# Patient Record
Sex: Male | Born: 1935 | Race: White | Hispanic: No | Marital: Married | Smoking: Former smoker
Health system: Southern US, Community
[De-identification: ages and names within clinical notes are randomized; demographics above are authoritative.]

## PROBLEM LIST (undated history)

## (undated) DIAGNOSIS — I10 Essential (primary) hypertension: Secondary | ICD-10-CM

## (undated) DIAGNOSIS — C801 Malignant (primary) neoplasm, unspecified: Secondary | ICD-10-CM

## (undated) HISTORY — PX: CATARACT EXTRACTION, BILATERAL: SHX1313

---

## 2019-08-11 ENCOUNTER — Other Ambulatory Visit: Payer: Self-pay

## 2019-08-11 ENCOUNTER — Encounter: Payer: Self-pay | Admitting: Emergency Medicine

## 2019-08-11 ENCOUNTER — Emergency Department: Payer: Medicare Other

## 2019-08-11 ENCOUNTER — Emergency Department
Admission: EM | Admit: 2019-08-11 | Discharge: 2019-08-11 | Disposition: A | Discharge status: Home / Self Care | Payer: Medicare Other | Attending: Emergency Medicine | Admitting: Emergency Medicine

## 2019-08-11 DIAGNOSIS — Z87891 Personal history of nicotine dependence: Secondary | ICD-10-CM | POA: Insufficient documentation

## 2019-08-11 DIAGNOSIS — I10 Essential (primary) hypertension: Secondary | ICD-10-CM | POA: Insufficient documentation

## 2019-08-11 DIAGNOSIS — Z8546 Personal history of malignant neoplasm of prostate: Secondary | ICD-10-CM | POA: Insufficient documentation

## 2019-08-11 DIAGNOSIS — R0602 Shortness of breath: Secondary | ICD-10-CM | POA: Diagnosis present

## 2019-08-11 HISTORY — DX: Essential (primary) hypertension: I10

## 2019-08-11 HISTORY — DX: Malignant (primary) neoplasm, unspecified: C80.1

## 2019-08-11 LAB — FIBRIN DERIVATIVES D-DIMER (ARMC ONLY): Fibrin derivatives D-dimer (ARMC): 674.43 ng/mL (FEU) — ABNORMAL HIGH (ref 0.00–499.00)

## 2019-08-11 LAB — CBC
HCT: 41.3 % (ref 39.0–52.0)
Hemoglobin: 14.4 g/dL (ref 13.0–17.0)
MCH: 33.7 pg (ref 26.0–34.0)
MCHC: 34.9 g/dL (ref 30.0–36.0)
MCV: 96.7 fL (ref 80.0–100.0)
Platelets: 203 10*3/uL (ref 150–400)
RBC: 4.27 MIL/uL (ref 4.22–5.81)
RDW: 11.9 % (ref 11.5–15.5)
WBC: 5.8 10*3/uL (ref 4.0–10.5)
nRBC: 0 % (ref 0.0–0.2)

## 2019-08-11 LAB — COMPREHENSIVE METABOLIC PANEL
ALT: 18 U/L (ref 0–44)
AST: 21 U/L (ref 15–41)
Albumin: 4 g/dL (ref 3.5–5.0)
Alkaline Phosphatase: 45 U/L (ref 38–126)
Anion gap: 8 (ref 5–15)
BUN: 13 mg/dL (ref 8–23)
CO2: 22 mmol/L (ref 22–32)
Calcium: 8.5 mg/dL — ABNORMAL LOW (ref 8.9–10.3)
Chloride: 106 mmol/L (ref 98–111)
Creatinine, Ser: 0.85 mg/dL (ref 0.61–1.24)
GFR calc Af Amer: >60 mL/min (ref >60)
GFR calc non Af Amer: >60 mL/min (ref >60)
Glucose, Bld: 109 mg/dL — ABNORMAL HIGH (ref 70–99)
Potassium: 4.2 mmol/L (ref 3.5–5.1)
Sodium: 136 mmol/L (ref 135–145)
Total Bilirubin: 1.1 mg/dL (ref 0.3–1.2)
Total Protein: 7 g/dL (ref 6.5–8.1)

## 2019-08-11 LAB — TROPONIN I (HIGH SENSITIVITY)
Troponin I (High Sensitivity): 4 ng/L (ref <18)
Troponin I (High Sensitivity): 5 ng/L (ref <18)

## 2019-08-11 LAB — BRAIN NATRIURETIC PEPTIDE: B Natriuretic Peptide: 243 pg/mL — ABNORMAL HIGH (ref 0.0–100.0)

## 2019-08-11 MED ORDER — LORATADINE 10 MG PO CAPS: 10.0000 mg | ORAL_CAPSULE | Freq: Every day | ORAL | 0 refills | Status: DC

## 2019-08-11 MED ORDER — ALBUTEROL SULFATE (2.5 MG/3ML) 0.083% IN NEBU: 5.0000 mg | INHALATION_SOLUTION | Freq: Once | RESPIRATORY_TRACT | Status: DC

## 2019-08-11 MED ORDER — IOHEXOL 350 MG/ML SOLN
75.0000 mL | Freq: Once | INTRAVENOUS | Status: AC | PRN
  Administered 2019-08-11: 75 mL via INTRAVENOUS

## 2019-08-11 NOTE — ED Provider Notes (Signed)
[facility] Emergency Department Provider Note  ____________________________________________  Time seen: Approximately 5:11 PM  I have reviewed the triage vital signs and the nursing notes.   HISTORY  Chief Complaint Shortness of Breath    HPI Edward Livingston is a 86 y.o. male with a history of hypertension and prostate cancer who comes ED complaining of shortness of breath that is intermittent, occurring over the last 3 days or so.  Worse laying down, better sitting upright.  Not worse with exertion.  Denies chest pain.  Reports some lower extremity edema without leg pain.  His doctor prescribed him albuterol which does not seem to be helping.  He lives in [place] and flew out to [place] 4 days ago for vacation.  Reports that his prostate cancer was treated with chemo and radiation and he regards it as cured.      Past Medical History:  Diagnosis Date  . Cancer [facility])    Prostate  . Hypertension      There are no problems to display for this patient.    History reviewed. No pertinent surgical history.   Prior to Admission medications   Medication Sig Start Date End Date Taking? Authorizing Provider  Loratadine 10 MG CAPS Take 1 capsule (10 mg total) by mouth at bedtime for 20 days. 08/11/19 08/31/19  Carrie Mew, MD     Allergies Patient has no known allergies.   History reviewed. No pertinent family history.  Social History Social History   Tobacco Use  . Smoking status: Former Research scientist (life sciences)  . Smokeless tobacco: Never Used  Substance Use Topics  . Alcohol use: Yes  . Drug use: Never    Review of Systems  Constitutional:   No fever or chills.  ENT:   No sore throat. No rhinorrhea. Cardiovascular:   No chest pain or syncope. Respiratory: Positive shortness of breath without cough. Gastrointestinal:   Negative for abdominal pain, vomiting and diarrhea.  Musculoskeletal:   Negative for focal pain or swelling All other  systems reviewed and are negative except as documented above in ROS and HPI.  ____________________________________________   PHYSICAL EXAM:  VITAL SIGNS: ED Triage Vitals  Enc Vitals Group     BP 08/11/19 1241 117/78     Pulse Rate 08/11/19 1241 83     Resp 08/11/19 1241 18     Temp 08/11/19 1241 98 F (36.7 C)     Temp Source 08/11/19 1241 Oral     SpO2 08/11/19 1241 98 %     Weight 08/11/19 1244 160 lb (72.6 kg)     Height 08/11/19 1244 5\' 4"  (1.626 m)     Head Circumference --      Peak Flow --      Pain Score 08/11/19 1244 0     Pain Loc --      Pain Edu? --      Excl. in [place]? --     Vital signs reviewed, nursing assessments reviewed.   Constitutional:   Alert and oriented. Non-toxic appearance. Eyes:   Conjunctivae are normal. EOMI. PERRL. ENT      Head:   Normocephalic and atraumatic.      Nose:   Wearing a mask.      Mouth/Throat:   Wearing a mask.      Neck:   No meningismus. Full ROM. Hematological/Lymphatic/Immunilogical:   No cervical lymphadenopathy. Cardiovascular:   RRR. Symmetric bilateral radial and DP pulses.  No murmurs. Cap refill less than 2  seconds. Respiratory:   Normal respiratory effort without tachypnea/retractions. Breath sounds are clear and equal bilaterally. No wheezes/rales/rhonchi.  No cough or wheezing with FEV1 maneuver. Gastrointestinal:   Soft and nontender. Non distended. There is no CVA tenderness.  No rebound, rigidity, or guarding.  Musculoskeletal:   Normal range of motion in all extremities. No joint effusions.  No lower extremity tenderness.  No edema.  Negative Bevelyn Buckles' sign Neurologic:   Normal speech and language.  Motor grossly intact. No acute focal neurologic deficits are appreciated.  Skin:    Skin is warm, dry and intact. No rash noted.  No petechiae, purpura, or bullae.  ____________________________________________    LABS (pertinent positives/negatives) (all labs ordered are listed, but only abnormal results are  displayed) Labs Reviewed  COMPREHENSIVE METABOLIC PANEL - Abnormal; Notable for the following components:      Result Value   Glucose, Bld 109 (*)    Calcium 8.5 (*)    All other components within normal limits  BRAIN NATRIURETIC PEPTIDE - Abnormal; Notable for the following components:   B Natriuretic Peptide 243.0 (*)    All other components within normal limits  FIBRIN DERIVATIVES D-DIMER ([facility] ONLY) - Abnormal; Notable for the following components:   Fibrin derivatives D-dimer [facility]) 674.43 (*)    All other components within normal limits  CBC  TROPONIN I (HIGH SENSITIVITY)  TROPONIN I (HIGH SENSITIVITY)   ____________________________________________   EKG  Interpreted by me Atrial fibrillation, rate of 74, left axis, normal intervals.  Poor R wave progression.  Normal ST segments and T waves.  ____________________________________________    [id number]  DG Chest 2 View  Result Date: 08/11/2019 CLINICAL DATA:  Shortness of breath. Additional provided: Patient reports having a sleep up in a recliner, orthopnea, leg swelling. EXAM: CHEST - 2 VIEW COMPARISON:  No pertinent prior studies available for comparison. FINDINGS: Mild cardiomegaly. Aortic atherosclerosis. No appreciable airspace consolidation or overt pulmonary edema. No evidence of pleural effusion or pneumothorax. No acute bony abnormality identified. Thoracic spondylosis. IMPRESSION: Mild cardiomegaly. Aortic Atherosclerosis (ICD10-I70.0). No appreciable airspace consolidation or overt pulmonary edema. Thoracic spondylosis. Electronically Signed   By: Kellie Simmering DO   On: 08/11/2019 13:35   CT Angio Chest PE W and/or Wo Contrast  Result Date: 08/11/2019 CLINICAL DATA:  Shortness of breath.  Orthopnea and leg swelling. EXAM: CT ANGIOGRAPHY CHEST WITH CONTRAST TECHNIQUE: Multidetector CT imaging of the chest was performed using the standard protocol during bolus administration of intravenous contrast. Multiplanar CT image  reconstructions and MIPs were obtained to evaluate the vascular anatomy. CONTRAST:  46mL OMNIPAQUE IOHEXOL 350 MG/ML SOLN COMPARISON:  None. FINDINGS: Cardiovascular: No cardiomegaly, although the left atrium has a prominent size. Small low-density pericardial effusion. No pulmonary artery filling defect. Atherosclerotic calcification of the aorta and coronaries. Mediastinum/Nodes: No adenopathy or mass. Lungs/Pleura: Central airways are clear. There is no edema, consolidation, effusion, or pneumothorax. Upper Abdomen: IVC reflux but no right heart dilatation. Musculoskeletal: Spondylosis/spondyloarthropathy with bridging lower thoracic osteophytes. No acute finding Review of the MIP images confirms the above findings. IMPRESSION: 1. No acute finding, including pulmonary embolism. 2. Aortic Atherosclerosis (ICD10-I70.0).  Coronary atherosclerosis. Electronically Signed   By: Monte Fantasia M.D.   On: 08/11/2019 19:01    ____________________________________________   PROCEDURES Procedures  ____________________________________________  DIFFERENTIAL DIAGNOSIS   Non-STEMI, pulmonary embolism, seasonal allergies, fatigue  CLINICAL IMPRESSION / ASSESSMENT AND PLAN / ED COURSE  Medications ordered in the ED: Medications  iohexol (OMNIPAQUE) 350 MG/ML injection 75 mL (  75 mLs Intravenous Contrast Given 08/11/19 1837)    Pertinent labs & imaging results that were available during my care of the patient were reviewed by me and considered in my medical decision making (see chart for details).  Edward Livingston was evaluated in Emergency Department on 08/11/2019 for the symptoms described in the history of present illness. He was evaluated in the context of the global COVID-19 pandemic, which necessitated consideration that the patient might be at risk for infection with the SARS-CoV-2 virus that causes COVID-19. Institutional protocols and algorithms that pertain to the evaluation of patients at risk for  COVID-19 are in a state of rapid change based on information released by regulatory bodies including the CDC and federal and state organizations. These policies and algorithms were followed during the patient's care in the ED.   Patient presents with shortness of breath with lying down without evidence of frank congestive heart failure based on symptoms and exam. Considering the patient's symptoms, medical history, and physical examination today, I have low suspicion for ACS, TAD, pneumothorax, carditis, mediastinitis, pneumonia, CHF, or sepsis.  Vital signs normal, exam normal, initial labs and first troponin normal.  Given comorbidities, will obtain a second troponin to evaluate for non-STEMI.  With history of prostate cancer and travel, obtain D-dimer to eval for PE given normal vital signs and lack of chest pain and intermittent shortness of breath being unlikely to be due to PE.  Patient has a history of "cardiac arrhythmia" from 2 years ago from PCP, likely his A. fib seen on EKG today is chronic.  We will obtain a second troponin, D-dimer.  If unremarkable would start on H2 blocker, discharge for outpatient follow-up.    Clinical Course as of Aug 10 1913  Fri Aug 11, 2019  1912 CT angiogram of the chest negative for PE or other acute findings.   [PS]    Clinical Course User Index [PS] Carrie Mew, MD     ----------------------------------------- 7:15 PM on 08/11/2019 -----------------------------------------  Serial troponins negative.  ____________________________________________   FINAL CLINICAL IMPRESSION(S) / ED DIAGNOSES    Final diagnoses:  SOB (shortness of breath)     ED Discharge Orders         Ordered    Loratadine 10 MG CAPS  Daily at bedtime     08/11/19 1915          Portions of this note were generated with dragon dictation software. Dictation errors may occur despite best attempts at proofreading.   Carrie Mew, MD 08/11/19  [phone]

## 2019-08-11 NOTE — ED Triage Notes (Signed)
First nurse note- here from [place]. Has been having SHOB for a while. Given inhalers by MD. Has to sleep up in recliner. + orthopnea. + leg swelling. No dx CHF.

## 2019-08-11 NOTE — Discharge Instructions (Signed)
Your EKG, lab test, and CT scan of the chest were all okay today.  Please start taking a daily antihistamine, and follow-up with cardiology if your symptoms do not resolve within the next few days.

## 2019-08-11 NOTE — ED Notes (Signed)
2nd sets of blood tubes sent to lab, red blue and green

## 2019-08-11 NOTE — ED Triage Notes (Signed)
Pt to ED with c/o of SOB and feeling anxious. Pt states he can't lay down to sleep. Pt denies chest pain at this time. Pt prescribed inhalers by PCP with no relief.

## 2019-08-15 ENCOUNTER — Encounter: Payer: Self-pay | Admitting: Cardiology

## 2019-08-15 ENCOUNTER — Other Ambulatory Visit: Payer: Self-pay

## 2019-08-15 ENCOUNTER — Ambulatory Visit (INDEPENDENT_AMBULATORY_CARE_PROVIDER_SITE_OTHER): Payer: Medicare Other | Admitting: Cardiology

## 2019-08-15 VITALS — BP 116/78 | HR 89 | Ht 64.0 in | Wt 158.0 lb

## 2019-08-15 DIAGNOSIS — I4891 Unspecified atrial fibrillation: Secondary | ICD-10-CM | POA: Diagnosis not present

## 2019-08-15 DIAGNOSIS — R0602 Shortness of breath: Secondary | ICD-10-CM

## 2019-08-15 MED ORDER — APIXABAN 5 MG PO TABS: 5.0000 mg | ORAL_TABLET | Freq: Two times a day (BID) | ORAL | 2 refills | Status: AC

## 2019-08-15 NOTE — Patient Instructions (Signed)
Medication Instructions:  Your physician has recommended you make the following change in your medication:  START Eliquis 5 mg (1tablet) by mouth two times a day.  *If you need a refill on your cardiac medications before your next appointment, please call your pharmacy*  Lab Work: none If you have labs (blood work) drawn today and your tests are completely normal, you will receive your results only by: Marland Kitchen MyChart Message (if you have MyChart) OR . A paper copy in the mail If you have any lab test that is abnormal or we need to change your treatment, we will call you to review the results.   Testing/Procedures: Your physician has requested that you have an echocardiogram. Echocardiography is a painless test that uses sound waves to create images of your heart. It provides your doctor with information about the size and shape of your heart and how well your heart's chambers and valves are working. This procedure takes approximately one hour. There are no restrictions for this procedure. You may get an IV, if needed, to receive an ultrasound enhancing agent through to better visualize your heart.     Follow-Up: At [facility], you and your health needs are our priority.  As part of our continuing mission to provide you with exceptional heart care, we have created designated Provider Care Teams.  These Care Teams include your primary Cardiologist (physician) and Advanced Practice Providers (APPs -  Physician Assistants and Nurse Practitioners) who all work together to provide you with the care you need, when you need it.  We recommend signing up for the patient portal called "MyChart".  Sign up information is provided on this After Visit Summary.  MyChart is used to connect with patients for Virtual Visits (Telemedicine).  Patients are able to view lab/test results, encounter notes, upcoming appointments, etc.  Non-urgent messages can be sent to your provider as well.   To learn more about what  you can do with MyChart, go to [email or web address].    Your next appointment:   After echo as soon as possible.  The format for your next appointment:   In Person  Provider:   Kate Sable, MD    Echocardiogram An echocardiogram is a procedure that uses painless sound waves (ultrasound) to produce an image of the heart. Images from an echocardiogram can provide important information about:  Signs of coronary artery disease (CAD).  Aneurysm detection. An aneurysm is a weak or damaged part of an artery wall that bulges out from the normal force of blood pumping through the body.  Heart size and shape. Changes in the size or shape of the heart can be associated with certain conditions, including heart failure, aneurysm, and CAD.  Heart muscle function.  Heart valve function.  Signs of a past heart attack.  Fluid buildup around the heart.  Thickening of the heart muscle.  A tumor or infectious growth around the heart valves. Tell a health care provider about:  Any allergies you have.  All medicines you are taking, including vitamins, herbs, eye drops, creams, and over-the-counter medicines.  Any blood disorders you have.  Any surgeries you have had.  Any medical conditions you have.  Whether you are pregnant or may be pregnant. What are the risks? Generally, this is a safe procedure. However, problems may occur, including:  Allergic reaction to dye (contrast) that may be used during the procedure. What happens before the procedure? No specific preparation is needed. You may eat and drink normally. What happens  during the procedure?   An IV tube may be inserted into one of your veins.  You may receive contrast through this tube. A contrast is an injection that improves the quality of the pictures from your heart.  A gel will be applied to your chest.  A wand-like tool (transducer) will be moved over your chest. The gel will help to transmit the sound  waves from the transducer.  The sound waves will harmlessly bounce off of your heart to allow the heart images to be captured in real-time motion. The images will be recorded on a computer. The procedure may vary among health care providers and hospitals. What happens after the procedure?  You may return to your normal, everyday life, including diet, activities, and medicines, unless your health care provider tells you not to do that. Summary  An echocardiogram is a procedure that uses painless sound waves (ultrasound) to produce an image of the heart.  Images from an echocardiogram can provide important information about the size and shape of your heart, heart muscle function, heart valve function, and fluid buildup around your heart.  You do not need to do anything to prepare before this procedure. You may eat and drink normally.  After the echocardiogram is completed, you may return to your normal, everyday life, unless your health care provider tells you not to do that. This information is not intended to replace advice given to you by your health care provider. Make sure you discuss any questions you have with your health care provider. Document Revised: 07/14/2018 Document Reviewed: 04/25/2016 Elsevier Patient Education  [place].

## 2019-08-15 NOTE — Progress Notes (Signed)
Cardiology Office Note:    Date:  08/15/2019   ID:  Edward Livingston, DOB [birth year], MRN [id number]  PCP:  System, Pcp Not In  Cardiologist:  Kate Sable, MD  Electrophysiologist:  None   Referring MD: Carrie Mew, MD   Chief Complaint  Patient presents with  . Other    Patient c/o swelling in ankles and labored breathing at times. Meds reviewed verbally with patient.     History of Present Illness:    Edward Livingston is a 86 y.o. male with a hx of hypertension, prostate cancer being seen due to shortness of breath.  Patient was diagnosed with prostate cancer and underwent testosterone suppression and radiation therapy back in 2018/2019.  Since completion of radiation therapy, he states feeling more fatigued than usual.  He is visiting family in the area for 3 weeks.  His primary care physician and home is in [facility].  He endorses being out of breath with ordinary activity.  He denies chest pain.  Denies orthopnea, endorses occasional edema.  He denies palpitations.  Due to worsening shortness of breath and fatigue, he presented to the ED 3 days ago. In the ED, troponin was normal, EKG showed atrial fibrillation with controlled ventricular response, CTA chest was negative for PE.  Patient denies any prior history of A. fib or palpitations.  Denies any bleeding episodes apart from occasional hemorrhoidal bleeding.  Past Medical History:  Diagnosis Date  . Cancer [facility])    Prostate  . Hypertension     History reviewed. No pertinent surgical history.  Current Medications: Current Meds  Medication Sig  . amLODipine (NORVASC) 5 MG tablet Take 5 mg by mouth daily.  . Ascorbic Acid (VITAMIN C PO) Take by mouth daily.  . calcium-vitamin D (OSCAL WITH D) 500-200 MG-UNIT tablet Take 1 tablet by mouth.  . dorzolamide-timolol (COSOPT) 22.3-6.8 MG/ML ophthalmic solution INSTILL 1 DROP INTO EACH EYE TWICE DAILY  . irbesartan (AVAPRO) 150 MG tablet Take 150 mg by mouth  daily.  Marland Kitchen loratadine (CLARITIN) 10 MG tablet Take 10 mg by mouth at bedtime.  . Loratadine 10 MG CAPS Take 1 capsule (10 mg total) by mouth at bedtime for 20 days.  Marland Kitchen MAGNESIUM OXIDE PO Take by mouth.  . Multiple Vitamin (MULTI-VITAMIN PO) Take by mouth daily.  . Pyridoxine HCl (B-6 PO) Take by mouth daily.  . tamsulosin (FLOMAX) 0.4 MG CAPS capsule Take 0.8 mg by mouth at bedtime.  . timolol (TIMOPTIC-XR) 0.5 % ophthalmic gel-forming Apply to eye.     Allergies:   Patient has no known allergies.   Social History   Socioeconomic History  . Marital status: Married    Spouse name: Not on file  . Number of children: Not on file  . Years of education: Not on file  . Highest education level: Not on file  Occupational History  . Not on file  Tobacco Use  . Smoking status: Former Research scientist (life sciences)  . Smokeless tobacco: Never Used  Substance and Sexual Activity  . Alcohol use: Yes  . Drug use: Never  . Sexual activity: Not on file  Other Topics Concern  . Not on file  Social History Narrative  . Not on file   Social Determinants of Health   Financial Resource Strain:   . Difficulty of Paying Living Expenses:   Food Insecurity:   . Worried About Charity fundraiser in the Last Year:   . [place] in the Last Year:  Transportation Needs:   . Film/video editor (Medical):   Marland Kitchen Lack of Transportation (Non-Medical):   Physical Activity:   . Days of Exercise per Week:   . Minutes of Exercise per Session:   Stress:   . Feeling of Stress :   Social Connections:   . Frequency of Communication with Friends and Family:   . Frequency of Social Gatherings with Friends and Family:   . Attends Religious Services:   . Active Member of Clubs or Organizations:   . Attends Archivist Meetings:   Marland Kitchen Marital Status:      Family History: The patient's family history is not on file.  No known family history of heart disease.  ROS:   Please see the history of present illness.       All other systems reviewed and are negative.  EKGs/Labs/Other Studies Reviewed:    The following studies were reviewed today:   EKG:  EKG is  ordered today.  The ekg ordered today demonstrates atrial fibrillation, heart rate 89  Recent Labs: 08/11/2019: ALT 18; B Natriuretic Peptide 243.0; BUN 13; Creatinine, Ser 0.85; Hemoglobin 14.4; Platelets 203; Potassium 4.2; Sodium 136  Recent Lipid Panel No results found for: CHOL, TRIG, HDL, CHOLHDL, VLDL, LDLCALC, LDLDIRECT  Physical Exam:    VS:  BP 116/78 (BP Location: Right Arm, Patient Position: Sitting, Cuff Size: Normal)   Pulse 89   Ht 5\' 4"  (1.626 m)   Wt 158 lb (71.7 kg)   SpO2 98%   BMI 27.12 kg/m     Wt Readings from Last 3 Encounters:  08/15/19 158 lb (71.7 kg)  08/11/19 160 lb (72.6 kg)     GEN:  Well nourished, well developed in no acute distress HEENT: Normal NECK: No JVD; No carotid bruits LYMPHATICS: No lymphadenopathy CARDIAC: Irregular irregular, no murmurs, RESPIRATORY:  Clear to auscultation without rales, wheezing or rhonchi  ABDOMEN: Soft, non-tender, non-distended MUSCULOSKELETAL:  No edema; No deformity  SKIN: Warm and dry NEUROLOGIC:  Alert and oriented x 3 PSYCHIATRIC:  Normal affect   ASSESSMENT:    1. Atrial fibrillation, unspecified type ([place])   2. SOB (shortness of breath)    PLAN:    In order of problems listed above:  1. Patient with persistent atrial fibrillation, on sure about timing of onset.  EKG from ED showed atrial fibrillation.  CHA2DS2-VASc score of at least 3 (age, htn).  Patient is very functional, mows his lawn, performs all his activities of daily living by himself.  We will start Eliquis 5 mg twice daily for stroke risk reduction.  His heart rate is controlled off nodal medications.  Get echocardiogram, follow-up after echocardiogram.  Patient is visiting from [facility], plans to return in about 3 weeks.  Father at medication adjustment/management will depend on  echocardiogram findings.  If no gross abnormalities noted, will continue on current medicines including Eliquis.  Patient strongly advised to follow-up with primary and he will need a cardiologist back in his home city of [facility]. 2. Patient with worsening fatigue and also dyspnea worse with exertion.  Echocardiogram as above.  Denies chest pain.  Follow-up after echocardiogram.  This note was generated in part or whole with voice recognition software. Voice recognition is usually quite accurate but there are transcription errors that can and very often do occur. I apologize for any typographical errors that were not detected and corrected.  Medication Adjustments/Labs and Tests Ordered: Current medicines are reviewed at length with the patient  today.  Concerns regarding medicines are outlined above.  Orders Placed This Encounter  Procedures  . EKG 12-Lead  . ECHOCARDIOGRAM COMPLETE   Meds ordered this encounter  Medications  . apixaban (ELIQUIS) 5 MG TABS tablet    Sig: Take 1 tablet (5 mg total) by mouth 2 (two) times daily.    Dispense:  180 tablet    Refill:  2    Patient Instructions  Medication Instructions:  Your physician has recommended you make the following change in your medication:  START Eliquis 5 mg (1tablet) by mouth two times a day.  *If you need a refill on your cardiac medications before your next appointment, please call your pharmacy*  Lab Work: none If you have labs (blood work) drawn today and your tests are completely normal, you will receive your results only by: Marland Kitchen MyChart Message (if you have MyChart) OR . A paper copy in the mail If you have any lab test that is abnormal or we need to change your treatment, we will call you to review the results.   Testing/Procedures: Your physician has requested that you have an echocardiogram. Echocardiography is a painless test that uses sound waves to create images of your heart. It provides your  doctor with information about the size and shape of your heart and how well your heart's chambers and valves are working. This procedure takes approximately one hour. There are no restrictions for this procedure. You may get an IV, if needed, to receive an ultrasound enhancing agent through to better visualize your heart.     Follow-Up: At [facility], you and your health needs are our priority.  As part of our continuing mission to provide you with exceptional heart care, we have created designated Provider Care Teams.  These Care Teams include your primary Cardiologist (physician) and Advanced Practice Providers (APPs -  Physician Assistants and Nurse Practitioners) who all work together to provide you with the care you need, when you need it.  We recommend signing up for the patient portal called "MyChart".  Sign up information is provided on this After Visit Summary.  MyChart is used to connect with patients for Virtual Visits (Telemedicine).  Patients are able to view lab/test results, encounter notes, upcoming appointments, etc.  Non-urgent messages can be sent to your provider as well.   To learn more about what you can do with MyChart, go to [email or web address].    Your next appointment:   After echo as soon as possible.  The format for your next appointment:   In Person  Provider:   Kate Sable, MD    Echocardiogram An echocardiogram is a procedure that uses painless sound waves (ultrasound) to produce an image of the heart. Images from an echocardiogram can provide important information about:  Signs of coronary artery disease (CAD).  Aneurysm detection. An aneurysm is a weak or damaged part of an artery wall that bulges out from the normal force of blood pumping through the body.  Heart size and shape. Changes in the size or shape of the heart can be associated with certain conditions, including heart failure, aneurysm, and CAD.  Heart muscle function.  Heart  valve function.  Signs of a past heart attack.  Fluid buildup around the heart.  Thickening of the heart muscle.  A tumor or infectious growth around the heart valves. Tell a health care provider about:  Any allergies you have.  All medicines you are taking, including vitamins, herbs, eye drops, creams, and  over-the-counter medicines.  Any blood disorders you have.  Any surgeries you have had.  Any medical conditions you have.  Whether you are pregnant or may be pregnant. What are the risks? Generally, this is a safe procedure. However, problems may occur, including:  Allergic reaction to dye (contrast) that may be used during the procedure. What happens before the procedure? No specific preparation is needed. You may eat and drink normally. What happens during the procedure?   An IV tube may be inserted into one of your veins.  You may receive contrast through this tube. A contrast is an injection that improves the quality of the pictures from your heart.  A gel will be applied to your chest.  A wand-like tool (transducer) will be moved over your chest. The gel will help to transmit the sound waves from the transducer.  The sound waves will harmlessly bounce off of your heart to allow the heart images to be captured in real-time motion. The images will be recorded on a computer. The procedure may vary among health care providers and hospitals. What happens after the procedure?  You may return to your normal, everyday life, including diet, activities, and medicines, unless your health care provider tells you not to do that. Summary  An echocardiogram is a procedure that uses painless sound waves (ultrasound) to produce an image of the heart.  Images from an echocardiogram can provide important information about the size and shape of your heart, heart muscle function, heart valve function, and fluid buildup around your heart.  You do not need to do anything to prepare  before this procedure. You may eat and drink normally.  After the echocardiogram is completed, you may return to your normal, everyday life, unless your health care provider tells you not to do that. This information is not intended to replace advice given to you by your health care provider. Make sure you discuss any questions you have with your health care provider. Document Revised: 07/14/2018 Document Reviewed: 04/25/2016 Elsevier Patient Education  2020 [place], Kate Sable, MD  08/15/2019 10:48 AM    [place]

## 2019-08-16 ENCOUNTER — Telehealth: Payer: Self-pay | Admitting: Cardiology

## 2019-08-16 NOTE — Telephone Encounter (Signed)
Patient step daughter called and states that Eliquis is making dizzy and light headed  Pt c/o medication issue:  1. Name of Medication: Eliquis  2. How are you currently taking this medication (dosage and times per day)?  Just started last night  3. Are you having a reaction (difficulty breathing--STAT)? No  4. What is your medication issue? Dizziness and light headed

## 2019-08-16 NOTE — Telephone Encounter (Signed)
No DPR on file. Contacted the patient directly. [email or web address] to give permission to discuss his care.

## 2019-08-17 ENCOUNTER — Other Ambulatory Visit: Payer: Self-pay

## 2019-08-17 ENCOUNTER — Ambulatory Visit: Payer: Medicare Other

## 2019-08-17 DIAGNOSIS — R0602 Shortness of breath: Secondary | ICD-10-CM

## 2019-08-17 DIAGNOSIS — I4891 Unspecified atrial fibrillation: Secondary | ICD-10-CM

## 2019-08-17 NOTE — Telephone Encounter (Signed)
Attempted again to contact the patient. Spoke with the patients step daughter Olin Hauser who sts that the patient is currently having his echocardiogram.  Olin Hauser sts that the patient feels better today. Advised her that Eliquis should not cause any dizziness or light headiness.  I asked if the have been monitoring the patients VS. Olin Hauser sts that the patient BP this morning was 90's/60's she was unable to provide his HR. Adv her to have the patient hydrate and to continue to monitor his BP and HR and bring the readings with them to the patients 08/21/19 appt.  She also voiced that the patient is concerned about the affordability of Eliquis. He will return home to [place] on 08/28/19. Adv her that he can discuss with his cardiologist in [place] applying for patients assistance through the manufacturer or if an alternative may be more cost effective.  Olin Hauser verbalized understanding to the information given and voiced appreciation for the assistance.

## 2019-08-21 ENCOUNTER — Encounter: Payer: Self-pay | Admitting: Cardiology

## 2019-08-21 ENCOUNTER — Ambulatory Visit (INDEPENDENT_AMBULATORY_CARE_PROVIDER_SITE_OTHER): Payer: Medicare Other | Admitting: Cardiology

## 2019-08-21 ENCOUNTER — Other Ambulatory Visit: Payer: Self-pay

## 2019-08-21 VITALS — BP 128/80 | HR 79 | Ht 63.0 in | Wt 159.5 lb

## 2019-08-21 DIAGNOSIS — I4891 Unspecified atrial fibrillation: Secondary | ICD-10-CM

## 2019-08-21 DIAGNOSIS — I10 Essential (primary) hypertension: Secondary | ICD-10-CM

## 2019-08-21 MED ORDER — METOPROLOL SUCCINATE ER 25 MG PO TB24
25.0000 mg | ORAL_TABLET | Freq: Every day | ORAL | 0 refills | Status: DC
Start: 2019-08-21 — End: 2019-08-25

## 2019-08-21 NOTE — Progress Notes (Signed)
Cardiology Office Note:    Date:  08/21/2019   ID:  Edward Livingston, DOB 1932/03/06, MRN [id number]  PCP:  System, Pcp Not In  Cardiologist:  Kate Sable, MD  Electrophysiologist:  None   Referring MD: No ref. provider found   Chief Complaint  Patient presents with  . office visit    F/U after echo; Meds verbally reviewed with patient.    History of Present Illness:    Edward Livingston is a 86 y.o. male with a hx of hypertension, atrial fibrillation, prostate cancer being seen for follow-up.  He was last seen due to shortness of breath and atrial fibrillation.  CHA2DS2-VASc score was 3, he was started on Eliquis 5 mg twice daily.  He is visiting from [place] with plans to return in a week or so.  Echocardiogram was ordered to evaluate any structural abnormalities due to shortness of breath.  Patient states doing okay, heart a sudden episode of palpitation couple of days ago when he was about to go to sleep.  He states the cost for Eliquis was too high and he has plans to switch to Coumadin and frequent INR when he returns to [place] via his primary care's office and all his new cardiologist.  Historical notes Patient was diagnosed with prostate cancer and underwent testosterone suppression and radiation therapy back in 2018/2019.  Since completion of radiation therapy, he states feeling more fatigued than usual.  He is visiting family in the area for 3 weeks.  His primary care physician and home is in [facility].  He endorses being out of breath with ordinary activity.  He denies chest pain.  Denies orthopnea, endorses occasional edema.  He denies palpitations.  Due to worsening shortness of breath and fatigue, he presented to the ED due to shortness of breath, troponin was normal, EKG showed atrial fibrillation with controlled ventricular response, CTA chest was negative for PE.  Patient denies any prior history of A. fib or palpitations.  Denies any bleeding episodes  apart from occasional hemorrhoidal bleeding.  Past Medical History:  Diagnosis Date  . Cancer [facility])    Prostate  . Hypertension     Past Surgical History:  Procedure Laterality Date  . CATARACT EXTRACTION, BILATERAL      Current Medications: Current Meds  Medication Sig  . apixaban (ELIQUIS) 5 MG TABS tablet Take 1 tablet (5 mg total) by mouth 2 (two) times daily.  . Ascorbic Acid (VITAMIN C PO) Take by mouth daily.  . bimatoprost (LUMIGAN) 0.01 % SOLN 1 drop at bedtime.  . dorzolamide-timolol (COSOPT) 22.3-6.8 MG/ML ophthalmic solution INSTILL 1 DROP INTO EACH EYE TWICE DAILY  . irbesartan (AVAPRO) 150 MG tablet Take 150 mg by mouth daily.  Marland Kitchen loratadine (CLARITIN) 10 MG tablet Take 10 mg by mouth at bedtime.  Marland Kitchen MAGNESIUM OXIDE PO Take by mouth daily.   . Multiple Vitamin (MULTI-VITAMIN PO) Take by mouth daily.  . Pyridoxine HCl (B-6 PO) Take by mouth daily.  . tamsulosin (FLOMAX) 0.4 MG CAPS capsule Take 0.8 mg by mouth at bedtime.  . timolol (TIMOPTIC-XR) 0.5 % ophthalmic gel-forming Place 1 drop into both eyes in the morning and at bedtime.   . traZODone (DESYREL) 50 MG tablet Take 100 mg by mouth daily as needed.   . [DISCONTINUED] amLODipine (NORVASC) 5 MG tablet Take 5 mg by mouth daily.     Allergies:   Patient has no known allergies.   Social History   Socioeconomic History  .  Marital status: Married    Spouse name: Not on file  . Number of children: Not on file  . Years of education: Not on file  . Highest education level: Not on file  Occupational History  . Not on file  Tobacco Use  . Smoking status: Former Research scientist (life sciences)  . Smokeless tobacco: Never Used  Substance and Sexual Activity  . Alcohol use: Yes  . Drug use: Never  . Sexual activity: Not on file  Other Topics Concern  . Not on file  Social History Narrative  . Not on file   Social Determinants of Health   Financial Resource Strain:   . Difficulty of Paying Living Expenses:   Food Insecurity:     . Worried About Charity fundraiser in the Last Year:   . Arboriculturist in the Last Year:   Transportation Needs:   . Film/video editor (Medical):   Marland Kitchen Lack of Transportation (Non-Medical):   Physical Activity:   . Days of Exercise per Week:   . Minutes of Exercise per Session:   Stress:   . Feeling of Stress :   Social Connections:   . Frequency of Communication with Friends and Family:   . Frequency of Social Gatherings with Friends and Family:   . Attends Religious Services:   . Active Member of Clubs or Organizations:   . Attends Archivist Meetings:   Marland Kitchen Marital Status:      Family History: The patient's family history is not on file.  No known family history of heart disease.  ROS:   Please see the history of present illness.     All other systems reviewed and are negative.  EKGs/Labs/Other Studies Reviewed:    The following studies were reviewed today:   EKG:  EKG is  ordered today.  The ekg ordered today demonstrates atrial fibrillation, heart rate 89  Recent Labs: 08/11/2019: ALT 18; B Natriuretic Peptide 243.0; BUN 13; Creatinine, Ser 0.85; Hemoglobin 14.4; Platelets 203; Potassium 4.2; Sodium 136  Recent Lipid Panel No results found for: CHOL, TRIG, HDL, CHOLHDL, VLDL, LDLCALC, LDLDIRECT  Physical Exam:    VS:  BP 128/80 (BP Location: Left Arm, Patient Position: Sitting, Cuff Size: Normal)   Pulse 79   Ht 5\' 3"  (1.6 m)   Wt 159 lb 8 oz (72.3 kg)   SpO2 98%   BMI 28.25 kg/m     Wt Readings from Last 3 Encounters:  08/21/19 159 lb 8 oz (72.3 kg)  08/15/19 158 lb (71.7 kg)  08/11/19 160 lb (72.6 kg)     GEN:  Well nourished, well developed in no acute distress HEENT: Normal NECK: No JVD; No carotid bruits LYMPHATICS: No lymphadenopathy CARDIAC: Irregular irregular, no murmurs, RESPIRATORY:  Clear to auscultation without rales, wheezing or rhonchi  ABDOMEN: Soft, non-tender, non-distended MUSCULOSKELETAL:  No edema; No deformity   SKIN: Warm and dry NEUROLOGIC:  Alert and oriented x 3 PSYCHIATRIC:  Normal affect   ASSESSMENT:    1. Atrial fibrillation, unspecified type ([place])   2. Essential hypertension    PLAN:    In order of problems listed above:  1. Patient with persistent atrial fibrillation, echo shows normal systolic function, indeterminate diastolic function, EF 55 to 60% CHA2DS2-VASc score of at least 3 (age, htn).  Start Toprol-XL 25 mg daily, continue Eliquis 5 mg twice daily.  If patient was to stay in the area, will have recommended our Coumadin clinic due to cost of Eliquis.  Recommend patient establish with cardiology when he returns to [facility].  His primary care provider has given him a number for local cardiologist in [facility]. 2. History of hypertension.  Blood pressure controlled.  Start Toprol-XL 25 mg daily.  Continue Avapro.  Stop Norvasc..  Follow-up as needed if he is still in the area.  This note was generated in part or whole with voice recognition software. Voice recognition is usually quite accurate but there are transcription errors that can and very often do occur. I apologize for any typographical errors that were not detected and corrected.  Medication Adjustments/Labs and Tests Ordered: Current medicines are reviewed at length with the patient today.  Concerns regarding medicines are outlined above.  Orders Placed This Encounter  Procedures  . EKG 12-Lead   Meds ordered this encounter  Medications  . metoprolol succinate (TOPROL XL) 25 MG 24 hr tablet    Sig: Take 1 tablet (25 mg total) by mouth daily.    Dispense:  90 tablet    Refill:  0    Patient Instructions  Medication Instructions:  Your physician has recommended you make the following change in your medication:   STOP Amlodipine  START Metoprolol Succinate 25 mg daily. An Rx has been sent to your pharmacy.  *If you need a refill on your cardiac medications before your next  appointment, please call your pharmacy*   Lab Work: None ordered If you have labs (blood work) drawn today and your tests are completely normal, you will receive your results only by: Marland Kitchen MyChart Message (if you have MyChart) OR . A paper copy in the mail If you have any lab test that is abnormal or we need to change your treatment, we will call you to review the results.   Testing/Procedures: None ordered   Follow-Up: At [facility], you and your health needs are our priority.  As part of our continuing mission to provide you with exceptional heart care, we have created designated Provider Care Teams.  These Care Teams include your primary Cardiologist (physician) and Advanced Practice Providers (APPs -  Physician Assistants and Nurse Practitioners) who all work together to provide you with the care you need, when you need it.  We recommend signing up for the patient portal called "MyChart".  Sign up information is provided on this After Visit Summary.  MyChart is used to connect with patients for Virtual Visits (Telemedicine).  Patients are able to view lab/test results, encounter notes, upcoming appointments, etc.  Non-urgent messages can be sent to your provider as well.   To learn more about what you can do with MyChart, go to [email or web address].    Your next appointment:   As needed   The format for your next appointment:   In Person  Provider:    You may see Kate Sable, MD or one of the following Advanced Practice Providers on your designated Care Team:    Blessinger Hodgkins, NP  Christell Faith, PA-C  Marrianne Mood, PA-C    Other Instructions N/A     Signed, Kate Sable, MD  08/21/2019 4:46 PM    [place]

## 2019-08-21 NOTE — Patient Instructions (Signed)
Medication Instructions:  Your physician has recommended you make the following change in your medication:   STOP Amlodipine  START Metoprolol Succinate 25 mg daily. An Rx has been sent to your pharmacy.  *If you need a refill on your cardiac medications before your next appointment, please call your pharmacy*   Lab Work: None ordered If you have labs (blood work) drawn today and your tests are completely normal, you will receive your results only by: Marland Kitchen MyChart Message (if you have MyChart) OR . A paper copy in the mail If you have any lab test that is abnormal or we need to change your treatment, we will call you to review the results.   Testing/Procedures: None ordered   Follow-Up: At [facility], you and your health needs are our priority.  As part of our continuing mission to provide you with exceptional heart care, we have created designated Provider Care Teams.  These Care Teams include your primary Cardiologist (physician) and Advanced Practice Providers (APPs -  Physician Assistants and Nurse Practitioners) who all work together to provide you with the care you need, when you need it.  We recommend signing up for the patient portal called "MyChart".  Sign up information is provided on this After Visit Summary.  MyChart is used to connect with patients for Virtual Visits (Telemedicine).  Patients are able to view lab/test results, encounter notes, upcoming appointments, etc.  Non-urgent messages can be sent to your provider as well.   To learn more about what you can do with MyChart, go to [email or web address].    Your next appointment:   As needed   The format for your next appointment:   In Person  Provider:    You may see Kate Sable, MD or one of the following Advanced Practice Providers on your designated Care Team:    Volkov Hodgkins, NP  Christell Faith, PA-C  Marrianne Mood, PA-C    Other Instructions N/A

## 2019-08-24 ENCOUNTER — Telehealth: Payer: Self-pay | Admitting: Cardiology

## 2019-08-24 NOTE — Telephone Encounter (Signed)
Patient gave permission for me to speak with daughter. Daughter is very concerned about patient. He and his wife are supposed to fly back to [place] on Tuesday. Patient is having fatigue and weakness. He is unable to sleep more than 4-5 hours a night and gets up at night hardly able to catch his breath. He coughs up a lot of phlem and mucous. This has been going on for over 5 weeks. He had virtual visit with his PCP in [place] yesterday for insomnia. They're still working on getting referral to a cardiologist in [place] and are concerned about him returning there.  Daughter was not allowed into the visit with patient. She feels there is more she needs to help communicate to Dr. Garen Lah because her dad (the patient) is too tired to communicate his concerns. They want to make sure nothing else should be done prior to his return to [place].  Daughter and patient agreeable to appointment tomorrow morning with Dr. Garen Lah. Visitor is ok and this has been noted on patient's appt notes.   Pt and daughter verbalized understanding to call 911 or go to the emergency room, if he develops any new or worsening symptoms.

## 2019-08-24 NOTE — Telephone Encounter (Signed)
No answer. Left message to call back.   

## 2019-08-24 NOTE — Telephone Encounter (Signed)
Pt c/o BP issue: STAT if pt c/o blurred vision, one-sided weakness or slurred speech  1. What are your last 5 BP readings?  Today 05/20 91/56 HR 68  Half hour later  119/60 HR 72   2. Are you having any other symptoms (ex. Dizziness, headache, blurred vision, passed out)? Lightheaded, dizzy, exhausted   3. What is your BP issue? Patient has recently started new medications and BP has been fluctuating - now that BP has gone back up symptoms are a little better but still concerned.  Please call to discuss.  Best number to call is daughter [place] who patient is staying with at [phone].

## 2019-08-25 ENCOUNTER — Encounter: Payer: Self-pay | Admitting: Cardiology

## 2019-08-25 ENCOUNTER — Ambulatory Visit (INDEPENDENT_AMBULATORY_CARE_PROVIDER_SITE_OTHER): Payer: Medicare Other | Admitting: Cardiology

## 2019-08-25 VITALS — BP 108/60 | HR 77 | Ht 64.0 in | Wt 159.0 lb

## 2019-08-25 DIAGNOSIS — I10 Essential (primary) hypertension: Secondary | ICD-10-CM

## 2019-08-25 DIAGNOSIS — I4891 Unspecified atrial fibrillation: Secondary | ICD-10-CM | POA: Diagnosis not present

## 2019-08-25 DIAGNOSIS — R42 Dizziness and giddiness: Secondary | ICD-10-CM

## 2019-08-25 NOTE — Patient Instructions (Signed)
Medication Instructions:  Your physician has recommended you make the following change in your medication:  1.  STOP taking your Irbesartan. 2.  STOP taking your Toprol XL.  *If you need a refill on your cardiac medications before your next appointment, please call your pharmacy*   Lab Work: None Ordered. If you have labs (blood work) drawn today and your tests are completely normal, you will receive your results only by: Marland Kitchen MyChart Message (if you have MyChart) OR . A paper copy in the mail If you have any lab test that is abnormal or we need to change your treatment, we will call you to review the results.   Testing/Procedures: None Ordered.   Follow-Up: At [facility], you and your health needs are our priority.  As part of our continuing mission to provide you with exceptional heart care, we have created designated Provider Care Teams.  These Care Teams include your primary Cardiologist (physician) and Advanced Practice Providers (APPs -  Physician Assistants and Nurse Practitioners) who all work together to provide you with the care you need, when you need it.  We recommend signing up for the patient portal called "MyChart".  Sign up information is provided on this After Visit Summary.  MyChart is used to connect with patients for Virtual Visits (Telemedicine).  Patients are able to view lab/test results, encounter notes, upcoming appointments, etc.  Non-urgent messages can be sent to your provider as well.   To learn more about what you can do with MyChart, go to [email or web address].    Your next appointment:   1 week(s) If you are still in town.  The format for your next appointment:   In Person  Provider:   Kate Sable, MD   Other Instructions N/A

## 2019-08-25 NOTE — Progress Notes (Signed)
Cardiology Office Note:    Date:  08/25/2019   ID:  Edward [place], DOB September 04, 1931, MRN [id number]  PCP:  System, Pcp Not In  Cardiologist:  Kate Sable, MD  Electrophysiologist:  None   Referring MD: No ref. provider found   Chief Complaint  Patient presents with  . Other    worsening SOB, cannot sleep, dizziness, BP running low. meds reviewed verbally with patient.     History of Present Illness:    Edward Livingston is a 86 y.o. male with a hx of hypertension, atrial fibrillation, prostate cancer presents for follow-up.  He was last seen due to shortness of breath and atrial fibrillation.  Echocardiogram showed normal systolic function, indeterminate diastolic function, no gross structural abnormalities.  He was seen 3 days ago where Toprol-XL was started and Eliquis recommended.  Norvasc was stopped after starting beta-blocker.  He also takes irbesartan 150 mg for blood pressure.  Plans to follow-up with cardiologist in his home city [place] in less than 2 weeks. He complains of shortness of breath, dizziness and low blood pressures over the past few days.  Systolic runs anywhere from 90s to 130s on home measurements.  Historical notes Patient was diagnosed with prostate cancer and underwent testosterone suppression and radiation therapy back in 2018/2019.  Since completion of radiation therapy, he states feeling more fatigued than usual.  He is visiting family in the area for 3 weeks.  His primary care physician and home is in [facility]  He endorses being out of breath with ordinary activity.  He denies chest pain.  Denies orthopnea, endorses occasional edema.  He denies palpitations.  Due to worsening shortness of breath and fatigue, he presented to the ED due to shortness of breath, troponin was normal, EKG showed atrial fibrillation with controlled ventricular response, CTA chest was negative for PE.  Patient denies any prior history of A. fib or  palpitations.  Denies any bleeding episodes apart from occasional hemorrhoidal bleeding.  Past Medical History:  Diagnosis Date  . Cancer [facility])    Prostate  . Hypertension     Past Surgical History:  Procedure Laterality Date  . CATARACT EXTRACTION, BILATERAL      Current Medications: Current Meds  Medication Sig  . apixaban (ELIQUIS) 5 MG TABS tablet Take 1 tablet (5 mg total) by mouth 2 (two) times daily.  . Ascorbic Acid (VITAMIN C PO) Take by mouth daily.  . bimatoprost (LUMIGAN) 0.01 % SOLN 1 drop at bedtime.  . calcium-vitamin D (OSCAL WITH D) 500-200 MG-UNIT tablet Take 1 tablet by mouth.  . Dextromethorphan-guaiFENesin (DELSYM COUGH/CHEST CONGEST DM PO) Take by mouth.  . dorzolamide-timolol (COSOPT) 22.3-6.8 MG/ML ophthalmic solution INSTILL 1 DROP INTO EACH EYE TWICE DAILY  . loratadine (CLARITIN) 10 MG tablet Take 10 mg by mouth at bedtime.  Marland Kitchen MAGNESIUM OXIDE PO Take by mouth daily.   . Multiple Vitamin (MULTI-VITAMIN PO) Take by mouth daily.  . Pyridoxine HCl (B-6 PO) Take by mouth daily.  . tamsulosin (FLOMAX) 0.4 MG CAPS capsule Take 0.8 mg by mouth at bedtime.  . timolol (TIMOPTIC-XR) 0.5 % ophthalmic gel-forming Place 1 drop into both eyes in the morning and at bedtime.   . traZODone (DESYREL) 50 MG tablet Take 100 mg by mouth daily as needed.   . [DISCONTINUED] irbesartan (AVAPRO) 150 MG tablet Take 150 mg by mouth daily.  . [DISCONTINUED] metoprolol succinate (TOPROL XL) 25 MG 24 hr tablet Take 1 tablet (25 mg total)  by mouth daily.     Allergies:   Patient has no known allergies.   Social History   Socioeconomic History  . Marital status: Married    Spouse name: Not on file  . Number of children: Not on file  . Years of education: Not on file  . Highest education level: Not on file  Occupational History  . Not on file  Tobacco Use  . Smoking status: Former Research scientist (life sciences)  . Smokeless tobacco: Never Used  Substance and Sexual Activity  . Alcohol use: Yes  .  Drug use: Never  . Sexual activity: Not on file  Other Topics Concern  . Not on file  Social History Narrative  . Not on file   Social Determinants of Health   Financial Resource Strain:   . Difficulty of Paying Living Expenses:   Food Insecurity:   . Worried About Charity fundraiser in the Last Year:   . Arboriculturist in the Last Year:   Transportation Needs:   . Film/video editor (Medical):   Marland Kitchen Lack of Transportation (Non-Medical):   Physical Activity:   . Days of Exercise per Week:   . Minutes of Exercise per Session:   Stress:   . Feeling of Stress :   Social Connections:   . Frequency of Communication with Friends and Family:   . Frequency of Social Gatherings with Friends and Family:   . Attends Religious Services:   . Active Member of Clubs or Organizations:   . Attends Archivist Meetings:   Marland Kitchen Marital Status:      Family History: The patient's family history is not on file.  No known family history of heart disease.  ROS:   Please see the history of present illness.     All other systems reviewed and are negative.  EKGs/Labs/Other Studies Reviewed:    The following studies were reviewed today:   EKG:  EKG is  ordered today.  The ekg ordered today demonstrates atrial fibrillation, heart rate 77  Recent Labs: 08/11/2019: ALT 18; B Natriuretic Peptide 243.0; BUN 13; Creatinine, Ser 0.85; Hemoglobin 14.4; Platelets 203; Potassium 4.2; Sodium 136  Recent Lipid Panel No results found for: CHOL, TRIG, HDL, CHOLHDL, VLDL, LDLCALC, LDLDIRECT  Physical Exam:    VS:  BP 108/60 (BP Location: Left Arm, Patient Position: Sitting, Cuff Size: Normal)   Pulse 77   Ht 5\' 4"  (1.626 m)   Wt 159 lb (72.1 kg)   SpO2 98%   BMI 27.29 kg/m     Wt Readings from Last 3 Encounters:  08/25/19 159 lb (72.1 kg)  08/21/19 159 lb 8 oz (72.3 kg)  08/15/19 158 lb (71.7 kg)     GEN:  Well nourished, well developed in no acute distress HEENT: Normal NECK: No  JVD; No carotid bruits LYMPHATICS: No lymphadenopathy CARDIAC: Irregular irregular, no murmurs, RESPIRATORY:  Clear to auscultation without rales, wheezing or rhonchi  ABDOMEN: Soft, non-tender, non-distended MUSCULOSKELETAL:  No edema; No deformity  SKIN: Warm and dry NEUROLOGIC:  Alert and oriented x 3 PSYCHIATRIC:  Normal affect   ASSESSMENT:    1. Atrial fibrillation, unspecified type (Erin)   2. Essential hypertension   3. Dizziness    PLAN:    In order of problems listed above:  1. Patient with persistent atrial fibrillation, last echo shows normal systolic function, 99991111 score of 3 (age, htn).  Stop Toprol.  Patient was never tachycardic when diagnosed.  His ventricular rate  has always been controlled off beta-blockers. continue Eliquis 5 mg twice daily.  2. History of hypertension.  Blood pressure low normal, patient complains of dizziness.  stop Toprol-XL, stop irbesartan.  Patient advised to check blood pressure daily and monitor symptoms.  His Flomax, which is an alpha blocker could also be contributing.  Hydration encouraged. 3. Patient with history of dizziness and low blood pressures.  Orthostatic vitals obtained in the office today  Follow-up in a week if he is still in the area.  This note was generated in part or whole with voice recognition software. Voice recognition is usually quite accurate but there are transcription errors that can and very often do occur. I apologize for any typographical errors that were not detected and corrected.  Medication Adjustments/Labs and Tests Ordered: Current medicines are reviewed at length with the patient today.  Concerns regarding medicines are outlined above.  Orders Placed This Encounter  Procedures  . EKG 12-Lead   No orders of the defined types were placed in this encounter.   Patient Instructions  Medication Instructions:  Your physician has recommended you make the following change in your medication:  1.   STOP taking your Irbesartan. 2.  STOP taking your Toprol XL.  *If you need a refill on your cardiac medications before your next appointment, please call your pharmacy*   Lab Work: None Ordered. If you have labs (blood work) drawn today and your tests are completely normal, you will receive your results only by: Marland Kitchen MyChart Message (if you have MyChart) OR . A paper copy in the mail If you have any lab test that is abnormal or we need to change your treatment, we will call you to review the results.   Testing/Procedures: None Ordered.   Follow-Up: At [facility], you and your health needs are our priority.  As part of our continuing mission to provide you with exceptional heart care, we have created designated Provider Care Teams.  These Care Teams include your primary Cardiologist (physician) and Advanced Practice Providers (APPs -  Physician Assistants and Nurse Practitioners) who all work together to provide you with the care you need, when you need it.  We recommend signing up for the patient portal called "MyChart".  Sign up information is provided on this After Visit Summary.  MyChart is used to connect with patients for Virtual Visits (Telemedicine).  Patients are able to view lab/test results, encounter notes, upcoming appointments, etc.  Non-urgent messages can be sent to your provider as well.   To learn more about what you can do with MyChart, go to [email or web address].    Your next appointment:   1 week(s) If you are still in town.  The format for your next appointment:   In Person  Provider:   Kate Sable, MD   Other Instructions N/A     Signed, Kate Sable, MD  08/25/2019 12:28 PM    [place]

## 2019-08-26 ENCOUNTER — Other Ambulatory Visit: Payer: Self-pay

## 2019-08-26 ENCOUNTER — Telehealth: Payer: Self-pay | Admitting: Cardiology

## 2019-08-26 ENCOUNTER — Encounter: Payer: Self-pay | Admitting: Emergency Medicine

## 2019-08-26 DIAGNOSIS — F419 Anxiety disorder, unspecified: Secondary | ICD-10-CM | POA: Diagnosis present

## 2019-08-26 DIAGNOSIS — Z7901 Long term (current) use of anticoagulants: Secondary | ICD-10-CM | POA: Insufficient documentation

## 2019-08-26 DIAGNOSIS — Z8546 Personal history of malignant neoplasm of prostate: Secondary | ICD-10-CM | POA: Insufficient documentation

## 2019-08-26 DIAGNOSIS — M7989 Other specified soft tissue disorders: Secondary | ICD-10-CM | POA: Insufficient documentation

## 2019-08-26 DIAGNOSIS — Z79899 Other long term (current) drug therapy: Secondary | ICD-10-CM | POA: Insufficient documentation

## 2019-08-26 DIAGNOSIS — G4700 Insomnia, unspecified: Secondary | ICD-10-CM | POA: Insufficient documentation

## 2019-08-26 DIAGNOSIS — Z87891 Personal history of nicotine dependence: Secondary | ICD-10-CM | POA: Insufficient documentation

## 2019-08-26 DIAGNOSIS — I10 Essential (primary) hypertension: Secondary | ICD-10-CM | POA: Insufficient documentation

## 2019-08-26 LAB — BASIC METABOLIC PANEL
Anion gap: 9 (ref 5–15)
BUN: 13 mg/dL (ref 8–23)
CO2: 25 mmol/L (ref 22–32)
Calcium: 8.6 mg/dL — ABNORMAL LOW (ref 8.9–10.3)
Chloride: 103 mmol/L (ref 98–111)
Creatinine, Ser: 0.87 mg/dL (ref 0.61–1.24)
GFR calc Af Amer: >60 mL/min (ref >60)
GFR calc non Af Amer: >60 mL/min (ref >60)
Glucose, Bld: 115 mg/dL — ABNORMAL HIGH (ref 70–99)
Potassium: 4.1 mmol/L (ref 3.5–5.1)
Sodium: 137 mmol/L (ref 135–145)

## 2019-08-26 LAB — CBC WITH DIFFERENTIAL/PLATELET
Abs Immature Granulocytes: 0.01 10*3/uL (ref 0.00–0.07)
Basophils Absolute: 0 10*3/uL (ref 0.0–0.1)
Basophils Relative: 1 %
Eosinophils Absolute: 0.4 10*3/uL (ref 0.0–0.5)
Eosinophils Relative: 6 %
HCT: 40.6 % (ref 39.0–52.0)
Hemoglobin: 14.2 g/dL (ref 13.0–17.0)
Immature Granulocytes: 0 %
Lymphocytes Relative: 13 %
Lymphs Abs: 0.8 10*3/uL (ref 0.7–4.0)
MCH: 33.5 pg (ref 26.0–34.0)
MCHC: 35 g/dL (ref 30.0–36.0)
MCV: 95.8 fL (ref 80.0–100.0)
Monocytes Absolute: 0.7 10*3/uL (ref 0.1–1.0)
Monocytes Relative: 12 %
Neutro Abs: 3.9 10*3/uL (ref 1.7–7.7)
Neutrophils Relative %: 68 %
Platelets: 212 10*3/uL (ref 150–400)
RBC: 4.24 MIL/uL (ref 4.22–5.81)
RDW: 11.7 % (ref 11.5–15.5)
WBC: 5.7 10*3/uL (ref 4.0–10.5)
nRBC: 0 % (ref 0.0–0.2)

## 2019-08-26 NOTE — Telephone Encounter (Signed)
Patient called in reporting his HR has been elevated throughout the night along with blood pressure. HR running in the 110s. Had a visit yesterday and instructed to stop Toprol and norvasc as HR was controlled. Most recent blood pressures were 0000000 systolic. I advised that he should continue on Toprol 25mg  daily given his HR was now elevated and blood pressures stable. Would not resume the norvasc at this time. Remains on Eliquis. ER precautions given. He's had not chest pain or shortness of breath. He voiced understanding and thanked me for the follow up call.

## 2019-08-26 NOTE — Telephone Encounter (Signed)
Got paged by the patient and daughter, PAM reporting anxiety, cant calm down The daughter reports that the patient was just seen yesterday in the cardiology clinic and his medications- toprol xl was stopped, ibresartan was stopped due to low BP. Orthostats were negative  However, this morning again the daughter called and spoke to the caridology NP during which they decided to start Toprol xl back again as his HR and BP were up all night and this morning.  Now, the daughter reports that the patient is anxious and can't get comfortable. His most recent vitals are HR 101, BP 91/56 ,and  126/76. He took toprol xl 25mg  this morning. He has had anxiety, SOB/COPD from a while but today this seems to be worse. Denis any chest pain, unclear orthopnea or PND as he just cant sleep. Does have LE edema++  Recommendations: - Unclear what is exactly going on as he seems to have labile BP, labile HR (given Afib), no syncope but does have dizziness and recently multiple changes in his meds - I advised the daughter to bring him to the ER if he continues to feel poor to check his labs, ekg, repeat orthostats and eval him to make sure he is not in heart failure.

## 2019-08-26 NOTE — ED Triage Notes (Signed)
Pt arrives POV with anxiety and leg swelling. Pt reports calling his Dr in [place] for anxiety medication and being told to come to the ED by the dr on call here due to the leg swelling. Pt is in NAD.

## 2019-08-27 ENCOUNTER — Emergency Department: Payer: Medicare Other

## 2019-08-27 ENCOUNTER — Emergency Department
Admission: EM | Admit: 2019-08-27 | Discharge: 2019-08-27 | Disposition: A | Discharge status: Home / Self Care | Payer: Medicare Other | Attending: Emergency Medicine | Admitting: Emergency Medicine

## 2019-08-27 DIAGNOSIS — F419 Anxiety disorder, unspecified: Secondary | ICD-10-CM

## 2019-08-27 DIAGNOSIS — G4700 Insomnia, unspecified: Secondary | ICD-10-CM

## 2019-08-27 LAB — BRAIN NATRIURETIC PEPTIDE: B Natriuretic Peptide: 546.3 pg/mL — ABNORMAL HIGH (ref 0.0–100.0)

## 2019-08-27 MED ORDER — MELATONIN 5 MG PO TABS: 5.0000 mg | ORAL_TABLET | Freq: Every day | ORAL | 0 refills | Status: AC

## 2019-08-27 NOTE — ED Provider Notes (Signed)
[facility] Emergency Department Provider Note  ____________________________________________  Time seen: Approximately 1:00 AM  I have reviewed the triage vital signs and the nursing notes.   HISTORY  Chief Complaint Leg Swelling and Anxiety   HPI Edward Livingston is a 86 y.o. male with a history of A. fib on Eliquis, prostate cancer hypertension who presents for evaluation of anxiety. Patient is here visiting his family which she has not seen for over a year because of Covid. Since before leaving home patient started having panic attacks and difficulty sleeping. His doctor started him on trazodone which has not been helping much. Patient continues to have several daily episodes of panic attacks. He is not sure what the stressor is. He denies any prior history of anxiety, he denies depression. He reports that the panic attacks started with a couple of bills that he had to pay before leaving for vacation. He is returning home in 2 days. He called his PCP today to have something prescribed for anxiety but since his doctors in [place] he was told to come to the ER instead. Patient has never been on antianxiety medications before. Triage note says patient came in for leg swelling however patient reports the leg swelling is chronic and he has had for a very long time. No chest pain, no shortness of breath, no fever or chills, no back pain. Patient was recently seen here in the beginning of the month when he arrived in [place] for similar presentation. Has follow-up with cardiologist with an echocardiogram which is normal. Also had a CT of the chest which was normal.   Past Medical History:  Diagnosis Date  . Cancer [facility])    Prostate  . Hypertension     There are no problems to display for this patient.   Past Surgical History:  Procedure Laterality Date  . CATARACT EXTRACTION, BILATERAL      Prior to Admission medications   Medication Sig Start Date End  Date Taking? Authorizing Provider  apixaban (ELIQUIS) 5 MG TABS tablet Take 1 tablet (5 mg total) by mouth 2 (two) times daily. 08/15/19   Kate Sable, MD  Ascorbic Acid (VITAMIN C PO) Take by mouth daily.    [provider]  bimatoprost (LUMIGAN) 0.01 % SOLN 1 drop at bedtime.    [provider]  calcium-vitamin D (OSCAL WITH D) 500-200 MG-UNIT tablet Take 1 tablet by mouth.    [provider]  Dextromethorphan-guaiFENesin (DELSYM COUGH/CHEST CONGEST DM PO) Take by mouth.    [provider]  dorzolamide-timolol (COSOPT) 22.3-6.8 MG/ML ophthalmic solution INSTILL 1 DROP INTO EACH EYE TWICE DAILY 09/26/18   [provider]  loratadine (CLARITIN) 10 MG tablet Take 10 mg by mouth at bedtime. 08/11/19   [provider]  MAGNESIUM OXIDE PO Take by mouth daily.     [provider]  melatonin 5 MG TABS Take 1 tablet (5 mg total) by mouth at bedtime. 08/27/19   Rudene Re, MD  Multiple Vitamin (MULTI-VITAMIN PO) Take by mouth daily.    [provider]  Pyridoxine HCl (B-6 PO) Take by mouth daily.    [provider]  tamsulosin (FLOMAX) 0.4 MG CAPS capsule Take 0.8 mg by mouth at bedtime. 08/06/19   [provider]  timolol (TIMOPTIC-XR) 0.5 % ophthalmic gel-forming Place 1 drop into both eyes in the morning and at bedtime.     [provider]  traZODone (DESYREL) 50 MG tablet Take 100 mg  by mouth daily as needed.  08/15/19 08/15/20  [provider]    Allergies Patient has no known allergies.  No family history on file.  Social History Social History   Tobacco Use  . Smoking status: Former Research scientist (life sciences)  . Smokeless tobacco: Never Used  Substance Use Topics  . Alcohol use: Yes  . Drug use: Never    Review of Systems  Constitutional: Negative for fever. Eyes: Negative for visual changes. ENT: Negative for sore throat. Neck: No neck pain  Cardiovascular: Negative for chest pain.  Respiratory: Negative for shortness of breath. Gastrointestinal: Negative for abdominal pain, vomiting or diarrhea. Genitourinary: Negative for dysuria. Musculoskeletal: Negative for back pain. Skin: Negative for rash. Neurological: Negative for headaches, weakness or numbness. Psych: No SI or HI. + Anxiety and insomnia  ____________________________________________   PHYSICAL EXAM:  VITAL SIGNS: ED Triage Vitals [08/26/19 2137]  Enc Vitals Group     BP (!) 158/106     Pulse Rate 89     Resp 18     Temp 98.3 F (36.8 C)     Temp Source Oral     SpO2 98 %     Weight 159 lb (72.1 kg)     Height 5\' 4"  (1.626 m)     Head Circumference      Peak Flow      Pain Score 0     Pain Loc      Pain Edu?      Excl. in [place]?     Constitutional: Alert and oriented, looks slightly anxious  HEENT:      Head: Normocephalic and atraumatic.         Eyes: Conjunctivae are normal. Sclera is non-icteric.       Mouth/Throat: Mucous membranes are moist.       Neck: Supple with no signs of meningismus. Cardiovascular: Regular rate and rhythm. No murmurs, gallops, or rubs.  Respiratory: Normal respiratory effort. Lungs are clear to auscultation bilaterally.  Gastrointestinal: Soft, non tender. Musculoskeletal: 1+ pitting edema bilateral lower extremities with no erythema  neurologic: Normal speech and language. Face is symmetric. Moving all extremities. No gross focal neurologic deficits are appreciated. Skin: Skin is warm, dry and intact. No rash noted. Psychiatric: Mood and affect are normal. Speech and behavior are normal.  ____________________________________________   LABS (all labs ordered are listed, but only abnormal results are displayed)  Labs Reviewed  BASIC METABOLIC PANEL - Abnormal; Notable for the following components:      Result Value   Glucose, Bld 115 (*)    Calcium 8.6 (*)    All other components within normal limits  BRAIN NATRIURETIC PEPTIDE - Abnormal; Notable for  the following components:   B Natriuretic Peptide 546.3 (*)    All other components within normal limits  CBC WITH DIFFERENTIAL/PLATELET   ____________________________________________  EKG  ED ECG REPORT I, Rudene Re, the attending physician, personally viewed and interpreted this ECG.  Atrial fibrillation, rate of 81, normal QTC, left axis deviation, no ST elevations or depressions. Unchanged from prior. ____________________________________________  RADIOLOGY  I have personally reviewed the images performed during this visit and I agree with the Radiologist's read.   Interpretation by Radiologist:  DG Chest 2 View  Result Date: 08/27/2019 CLINICAL DATA:  Leg edema, anxiety and leg swelling EXAM: CHEST - 2 VIEW COMPARISON:  CT 08/11/2019, radiograph 08/11/2019 FINDINGS: Streaky basilar opacities most compatible with atelectasis. No consolidation, features of edema, pneumothorax, or effusion. Stable cardiomegaly. The aorta  is calcified. The remaining cardiomediastinal contours are unremarkable. No acute osseous or soft tissue abnormality. Degenerative changes are present in the imaged spine and shoulders. IMPRESSION: 1. Streaky basilar opacities most compatible with atelectasis. No focal consolidation or edema. 2. Stable cardiomegaly. Electronically Signed   By: Lovena Le M.D.   On: 08/27/2019 01:01      ____________________________________________   PROCEDURES  Procedure(s) performed:yes .1-3 Lead EKG Interpretation Performed by: Rudene Re, MD Authorized by: Rudene Re, MD     Interpretation: abnormal     ECG rate assessment: normal     Rhythm: atrial fibrillation     Ectopy: none     Critical Care performed:  None ____________________________________________   INITIAL IMPRESSION / ASSESSMENT AND PLAN / ED COURSE   85 y.o. male with a history of A. fib on Eliquis, prostate cancer hypertension who presents for evaluation of anxiety and  insomnia since arriving [place] couple of weeks ago on vacation visiting family. Patient looks slightly anxious. He has no medical complaints at this time. Was seen here several weeks ago for similar and at that time complaining as well as chest pain. Underwent a pretty extensive work-up including cardiology evaluation, echocardiogram, and a CT angio of the chest which were all unremarkable. Patient is currently on trazodone 100 mg at bedtime still unable to sleep. Recommended patient try melatonin 5 mg for the next 2 nights. If that does not help he can take melatonin and 1 trazodone. Recommended close follow-up with his primary care doctor. Discussed with the family that at this time I do not feel comfortable starting patient on an anxiety medication especially since he does not have a history of such and has never been on antianxiety medications before. Since he is about to return home in 2 days I recommended close follow-up with his PCP for management of that once he returns home. I discussed my standard return precautions. Old medical records reviewed. EKG, chest x-ray, and labs visualized with no acute findings. History gathered from patient and his stepdaughter who is at bedside.      _____________________________________________ Please note:  Patient was evaluated in Emergency Department today for the symptoms described in the history of present illness. Patient was evaluated in the context of the global COVID-19 pandemic, which necessitated consideration that the patient might be at risk for infection with the SARS-CoV-2 virus that causes COVID-19. Institutional protocols and algorithms that pertain to the evaluation of patients at risk for COVID-19 are in a state of rapid change based on information released by regulatory bodies including the CDC and federal and state organizations. These policies and algorithms were followed during the patient's care in the ED.  Some ED evaluations and  interventions may be delayed as a result of limited staffing during the pandemic.   [place] Controlled Substance Database was reviewed by me. ____________________________________________   FINAL CLINICAL IMPRESSION(S) / ED DIAGNOSES   Final diagnoses:  Anxiety  Insomnia, unspecified type      NEW MEDICATIONS STARTED DURING THIS VISIT:  ED Discharge Orders         Ordered    melatonin 5 MG TABS  Daily at bedtime     08/27/19 0106           Note:  This document was prepared using Dragon voice recognition software and may include unintentional dictation errors.    Alfred Levins, [place], MD 08/27/19 [phone]

## 2019-08-27 NOTE — ED Notes (Signed)
Assessment of "leg swelling" found significant bilateral 2+ pitting edema to bilateral ankles. Distal pedal pulses intact, cap refill <3 seconds. Pt denied numbness/tingling, loss of sensation. Pt able to ambulate without assistance.

## 2019-08-27 NOTE — Discharge Instructions (Signed)
Avoid caffeine. Take 5 mg of melatonin at bedtime for the next 2 nights. If that does not help with your insomnia you can take 5 mg of melatonin and 50 of trazodone. If taking trazodone avoid alcohol. You may take a glass of wine with the melatonin as long as you are not taking trazodone. Follow-up with your doctor when you return back home. Return to the emergency room for chest pain, shortness of breath, dizziness, passing out, or any other symptoms concerning to you.

## 2020-12-02 IMAGING — CR DG CHEST 2V
1 series · 2 of 2 positions shown · non-contrast
Comparison: CT 08/11/2019, radiograph 08/11/2019

CLINICAL DATA: Leg edema, anxiety and leg swelling

EXAM:
CHEST - 2 VIEW

[Series 1: dg chest 2 view · 0.14mm/px · 2 of 2 slices shown]
[im 1/2]
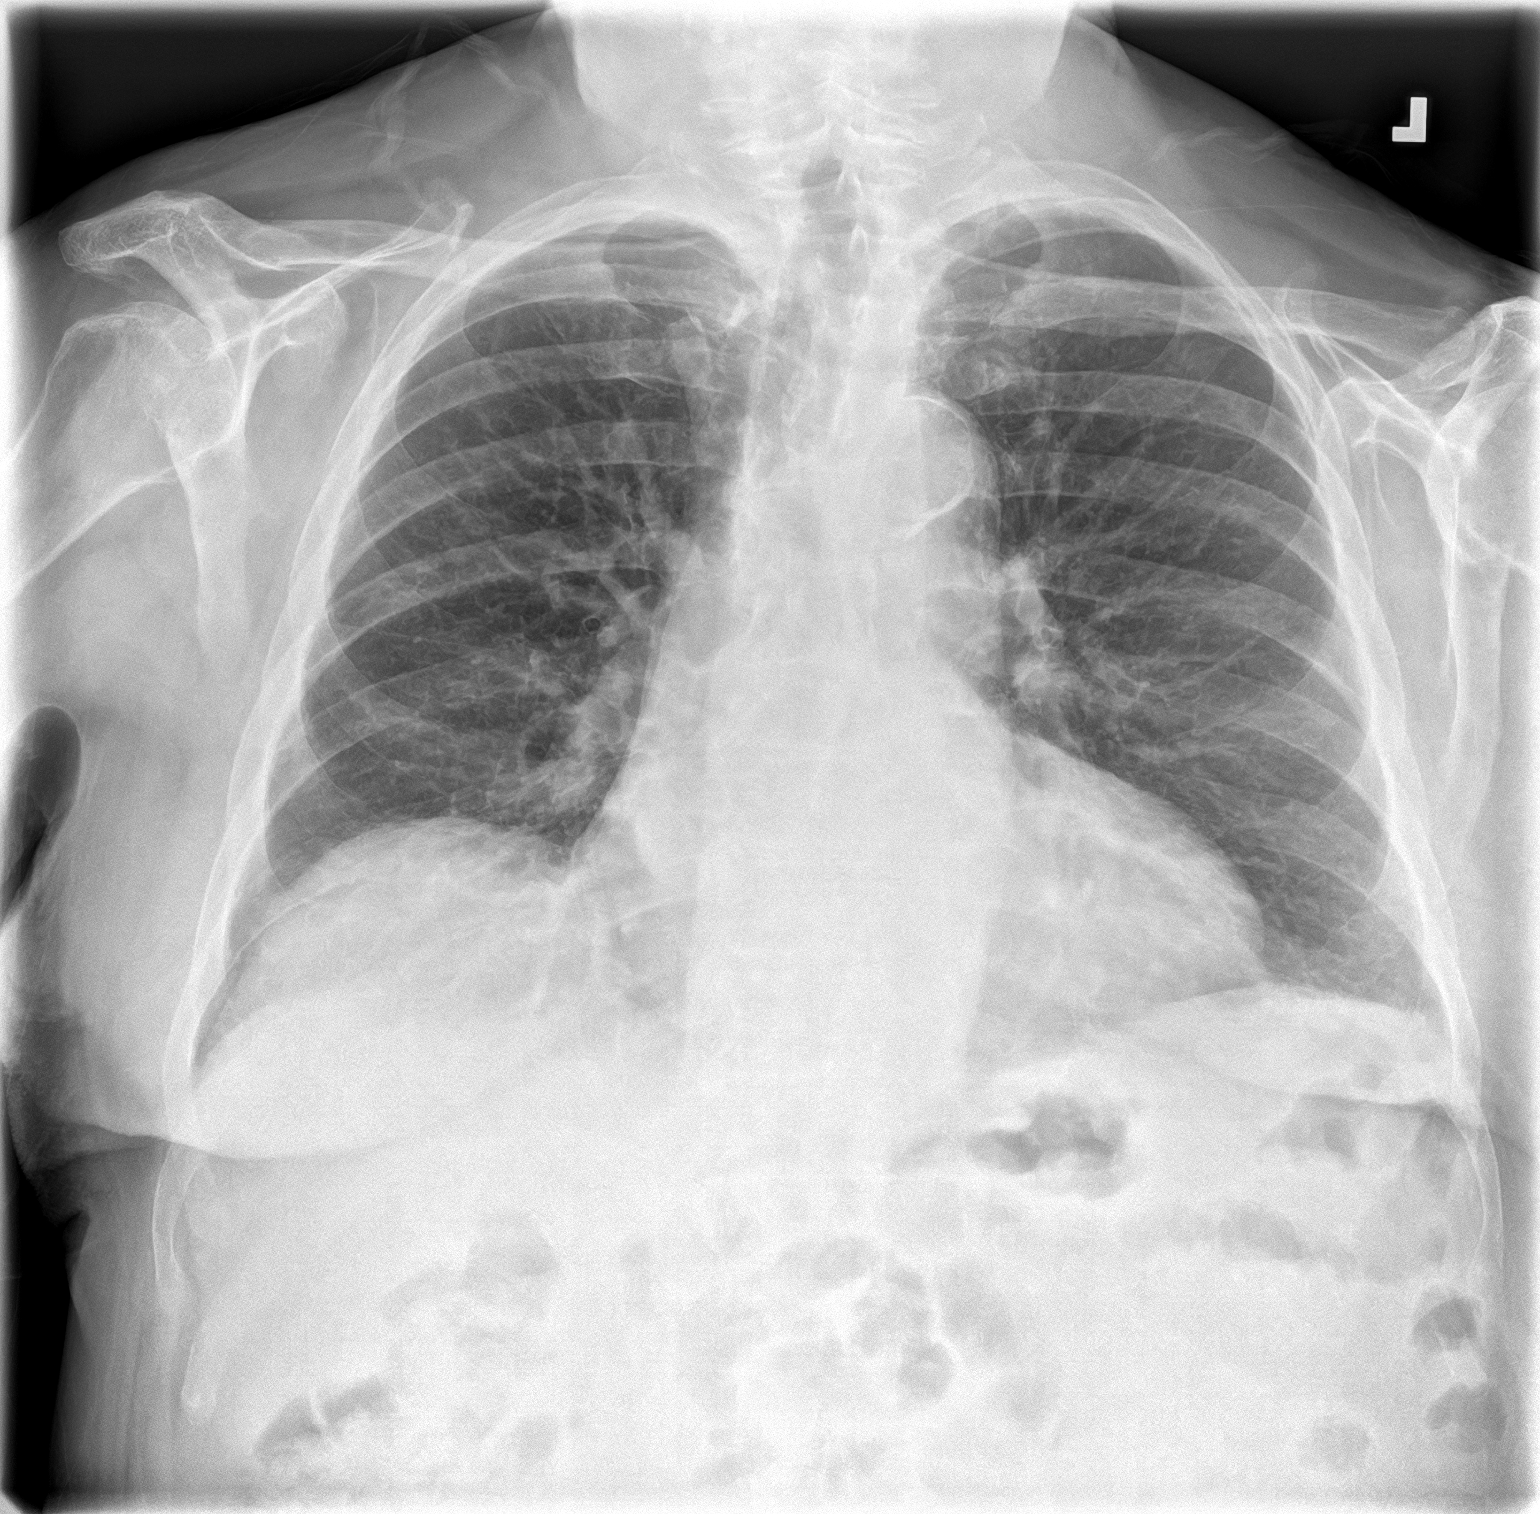
[im 2/2]
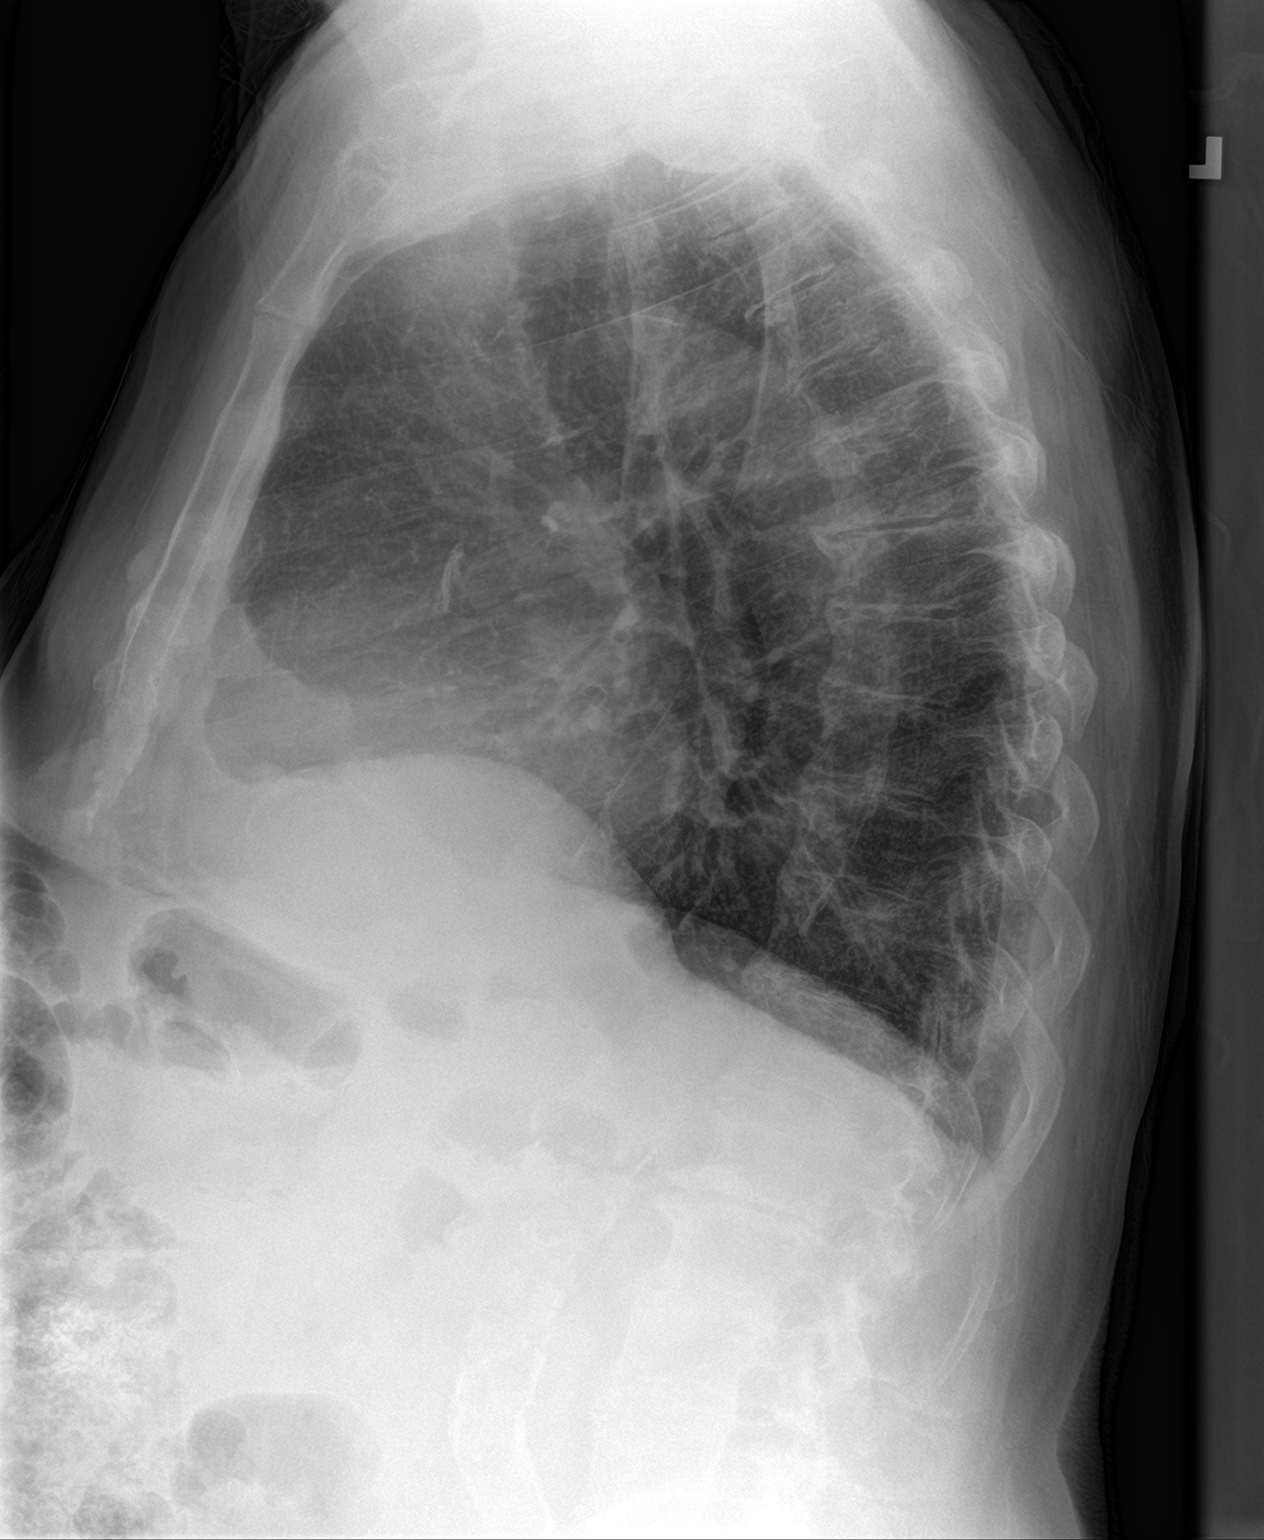

[2 of 2 positions shown; findings below may reference images not displayed]

FINDINGS: Streaky basilar opacities most compatible with atelectasis. No
consolidation, features of edema, pneumothorax, or effusion. Stable
cardiomegaly. The aorta is calcified. The remaining
cardiomediastinal contours are unremarkable. No acute osseous or
soft tissue abnormality. Degenerative changes are present in the
imaged spine and shoulders.
IMPRESSION: 1. Streaky basilar opacities most compatible with atelectasis. No
focal consolidation or edema.
2. Stable cardiomegaly.
